# Patient Record
Sex: Male | Born: 1985 | Race: Asian | Hispanic: No | Marital: Married | Smoking: Never smoker
Health system: Southern US, Community
[De-identification: ages and names within clinical notes are randomized; demographics above are authoritative.]

## PROBLEM LIST (undated history)

## (undated) DIAGNOSIS — E079 Disorder of thyroid, unspecified: Secondary | ICD-10-CM

## (undated) HISTORY — DX: Disorder of thyroid, unspecified: E07.9

---

## 2011-08-14 ENCOUNTER — Encounter (HOSPITAL_COMMUNITY): Payer: Self-pay | Admitting: Emergency Medicine

## 2011-08-14 ENCOUNTER — Emergency Department (HOSPITAL_COMMUNITY): Payer: BC Managed Care – PPO

## 2011-08-14 ENCOUNTER — Emergency Department (HOSPITAL_COMMUNITY)
Admission: EM | Admit: 2011-08-14 | Discharge: 2011-08-14 | Disposition: A | Discharge status: Home / Self Care | Payer: BC Managed Care – PPO | Attending: Emergency Medicine | Admitting: Emergency Medicine

## 2011-08-14 DIAGNOSIS — R05 Cough: Secondary | ICD-10-CM | POA: Insufficient documentation

## 2011-08-14 DIAGNOSIS — R509 Fever, unspecified: Secondary | ICD-10-CM | POA: Insufficient documentation

## 2011-08-14 DIAGNOSIS — R000 Tachycardia, unspecified: Secondary | ICD-10-CM | POA: Insufficient documentation

## 2011-08-14 DIAGNOSIS — R059 Cough, unspecified: Secondary | ICD-10-CM | POA: Insufficient documentation

## 2011-08-14 DIAGNOSIS — R51 Headache: Secondary | ICD-10-CM | POA: Insufficient documentation

## 2011-08-14 DIAGNOSIS — J40 Bronchitis, not specified as acute or chronic: Secondary | ICD-10-CM

## 2011-08-14 MED ORDER — ALBUTEROL SULFATE HFA 108 (90 BASE) MCG/ACT IN AERS
1.0000 | INHALATION_SPRAY | RESPIRATORY_TRACT | Status: DC | PRN
  Filled 2011-08-14: qty 6.7

## 2011-08-14 MED ORDER — KETOROLAC TROMETHAMINE 30 MG/ML IJ SOLN
30.0000 mg | Freq: Once | INTRAMUSCULAR | Status: AC
  Administered 2011-08-14: 30 mg via INTRAVENOUS
  Filled 2011-08-14: qty 1

## 2011-08-14 NOTE — Discharge Instructions (Signed)
Bronchitis     Bronchitis is a problem of the air tubes leading to your lungs. This problem makes it hard for air to get in and out of the lungs. You may cough a lot because your air tubes are narrow. Going without care can cause lasting (chronic) bronchitis.  HOME CARE   · Drink enough fluids to keep your pee (urine) clear or pale yellow.   · Use a cool mist humidifier.   · Quit smoking if you smoke. If you keep smoking, the bronchitis might not get better.   · Only take medicine as told by your doctor.   GET HELP RIGHT AWAY IF:   · Coughing keeps you awake.   · You start to wheeze.   · You become more sick or weak.   · You have a hard time breathing or get short of breath.   · You cough up blood.   · Coughing lasts more than 2 weeks.   · You have a fever.   · Your baby is older than 3 months with a rectal temperature of 102° F (38.9° C) or higher.   · Your baby is 3 months old or younger with a rectal temperature of 100.4° F (38° C) or higher.   MAKE SURE YOU:  · Understand these instructions.   · Will watch your condition.   · Will get help right away if you are not doing well or get worse.   Document Released: 11/27/2007 Document Revised: 02/20/2011 Document Reviewed: 05/12/2009  [employer]® Patient Information ©2012 [employer].

## 2011-08-14 NOTE — ED Notes (Signed)
Pt alert, nad, c/o headache, fever, onset a few days ago, pt ambulates to triage, steady gait noted, speech clear, resp even unlabored, skin pwd

## 2011-08-14 NOTE — ED Provider Notes (Signed)
History     CSN: [id number]  Arrival date & time 08/14/11  4098   First MD Initiated Contact with Patient 08/14/11 2122      Chief Complaint  Patient presents with  . Headache    HPI This previously well male presents with headache, fever, cough.  He notes his symptoms began gradually 3 days ago.  Since onset symptoms have been constant.  He notes improvement with Tylenol or Advil.  He does not have subjective fever to to the absence of a thermometer.  He notes no disorientation, no confusion, mild lightheadedness.  He also notes mild extremity dysesthesia. The headache is diffuse, no visual complaints, no dysphasia. History reviewed. No pertinent past medical history.  History reviewed. No pertinent past surgical history.  No family history on file.  History  Substance Use Topics  . Smoking status: Never Smoker   . Smokeless tobacco: Not on file  . Alcohol Use: No      Review of Systems  Constitutional:       Per HPI, otherwise negative  HENT:       Per HPI, otherwise negative  Eyes: Negative.   Respiratory:       Per HPI, otherwise negative  Cardiovascular:       Per HPI, otherwise negative  Gastrointestinal: Negative for vomiting.  Genitourinary: Negative.   Musculoskeletal:       Per HPI, otherwise negative  Skin: Negative.   Neurological: Negative for syncope.    Allergies  Review of patient's allergies indicates no known allergies.  Home Medications   Current Outpatient Rx  Name Route Sig Dispense Refill  . ACETAMINOPHEN 500 MG PO TABS Oral Take 1,000 mg by mouth every 6 (six) hours as needed. pain    . IBUPROFEN 200 MG PO TABS Oral Take 400 mg by mouth every 6 (six) hours as needed. pain      BP 110/59  Pulse 105  Temp(Src) 99.6 F (37.6 C) (Oral)  Resp 16  Wt 155 lb (70.308 kg)  SpO2 99%  Physical Exam  Nursing note and vitals reviewed. Constitutional: He is oriented to person, place, and time. He appears well-developed. No distress.    HENT:  Head: Normocephalic and atraumatic.  Eyes: Conjunctivae and EOM are normal.  Cardiovascular: Normal rate and regular rhythm.   Pulmonary/Chest: Effort normal. No stridor. No respiratory distress.  Abdominal: He exhibits no distension.  Musculoskeletal: He exhibits no edema.  Neurological: He is alert and oriented to person, place, and time. No cranial nerve deficit. He exhibits normal muscle tone. Coordination normal.  Skin: Skin is warm and dry.  Psychiatric: He has a normal mood and affect.    ED Course  Procedures (including critical care time)  Labs Reviewed - No data to display No results found.   No diagnosis found.  Pulse oximetry 100% room-air normal Chest x-ray reviewed by me MDM  This generally well appearing young male p/w several days of cough, headache, fever.  On exam he is in no distress, is not hypoxic, is mildly tachycardic.  The patient's x-ray demonstrates changes consistent with bronchitis.  Patient stable for discharge with continued evaluation as needed with his primary care physician.  The patient was provided referral to our associated clinics.  He was discharged with an albuterol inhaler.       Gerhard Munch, MD 08/14/11 [phone]

## 2012-08-19 IMAGING — CR DG CHEST 2V
2 series · 2 of 2 positions shown · non-contrast
Comparison: None.

CLINICAL DATA: Cough and fever.

CHEST - 2 VIEW

[w chest pa]
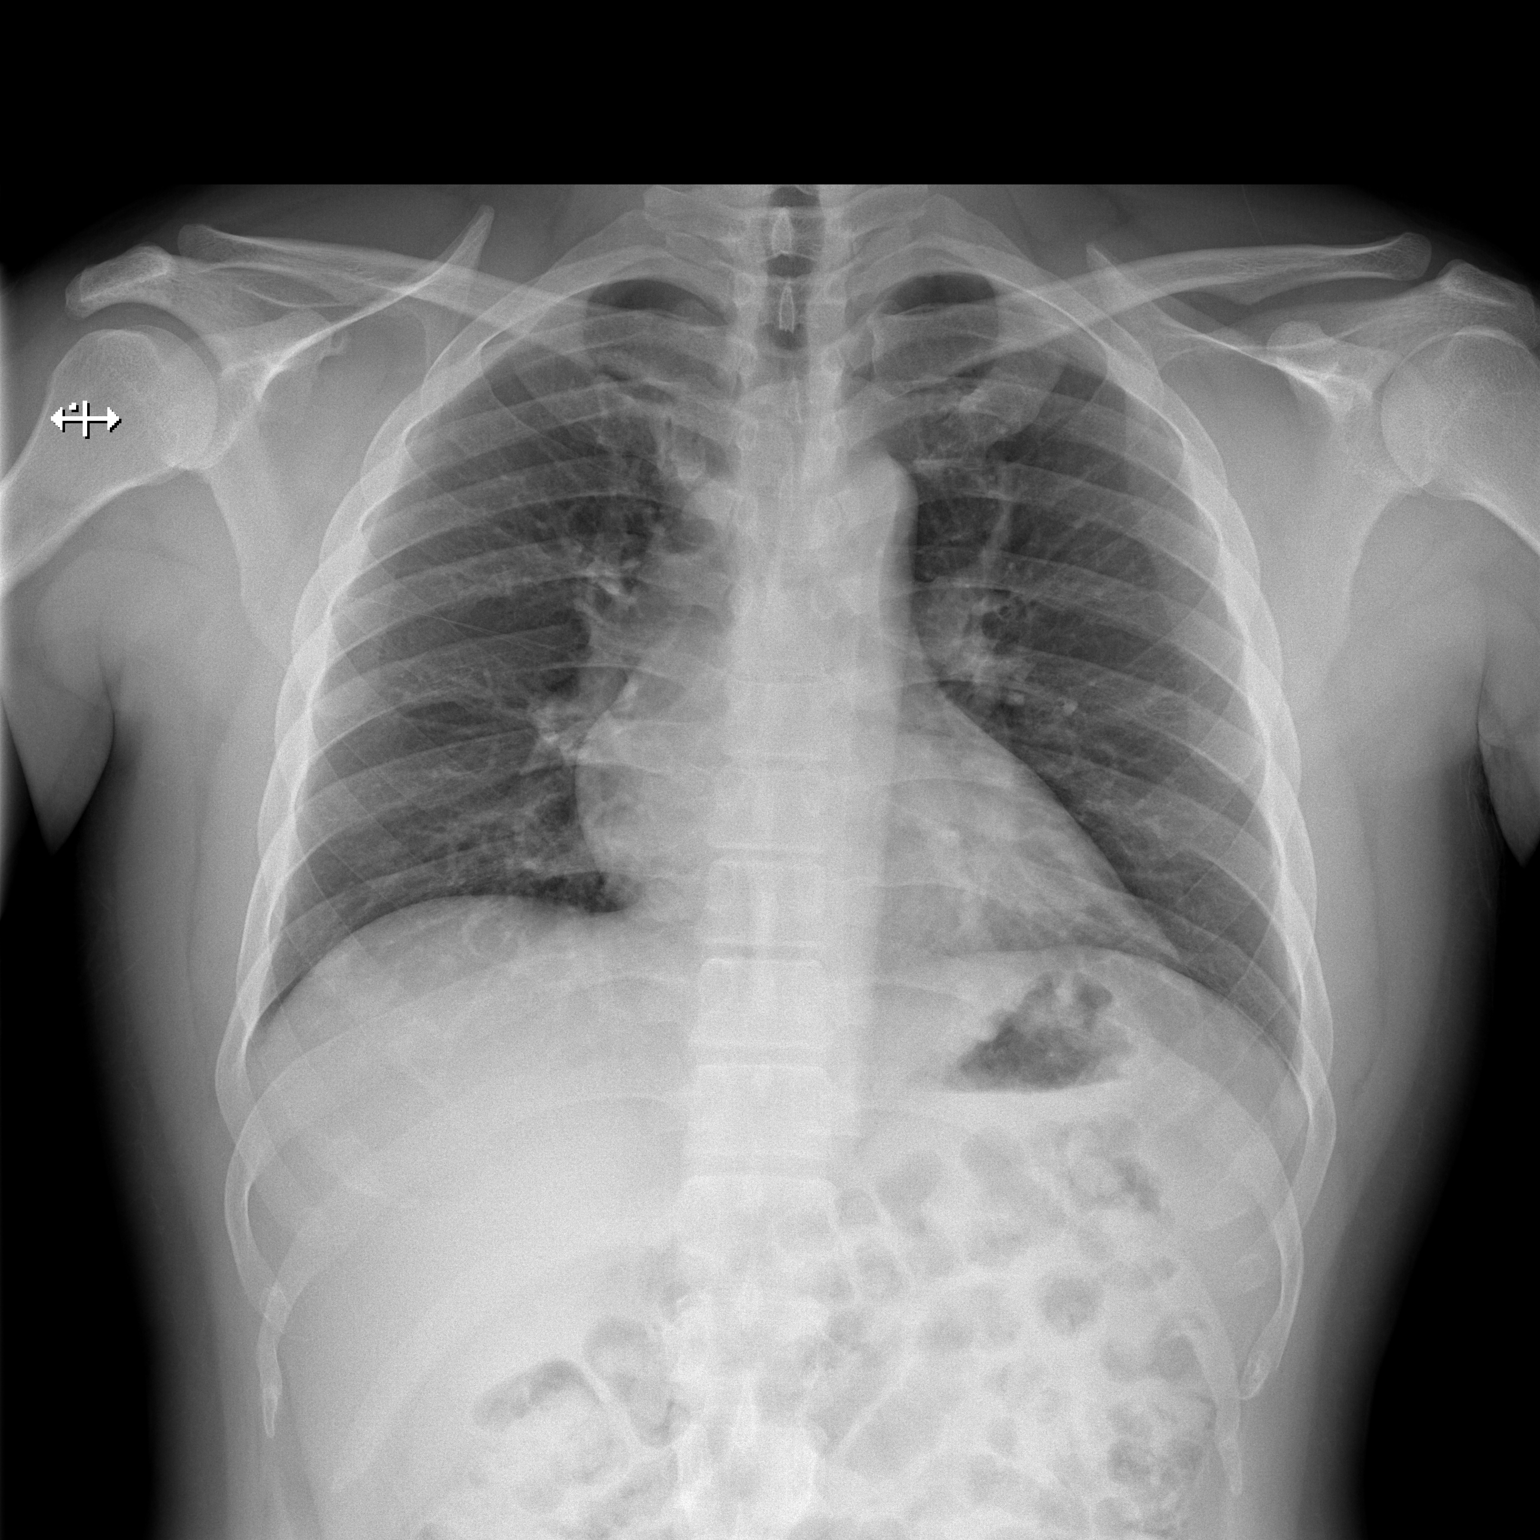

[w chest lat]
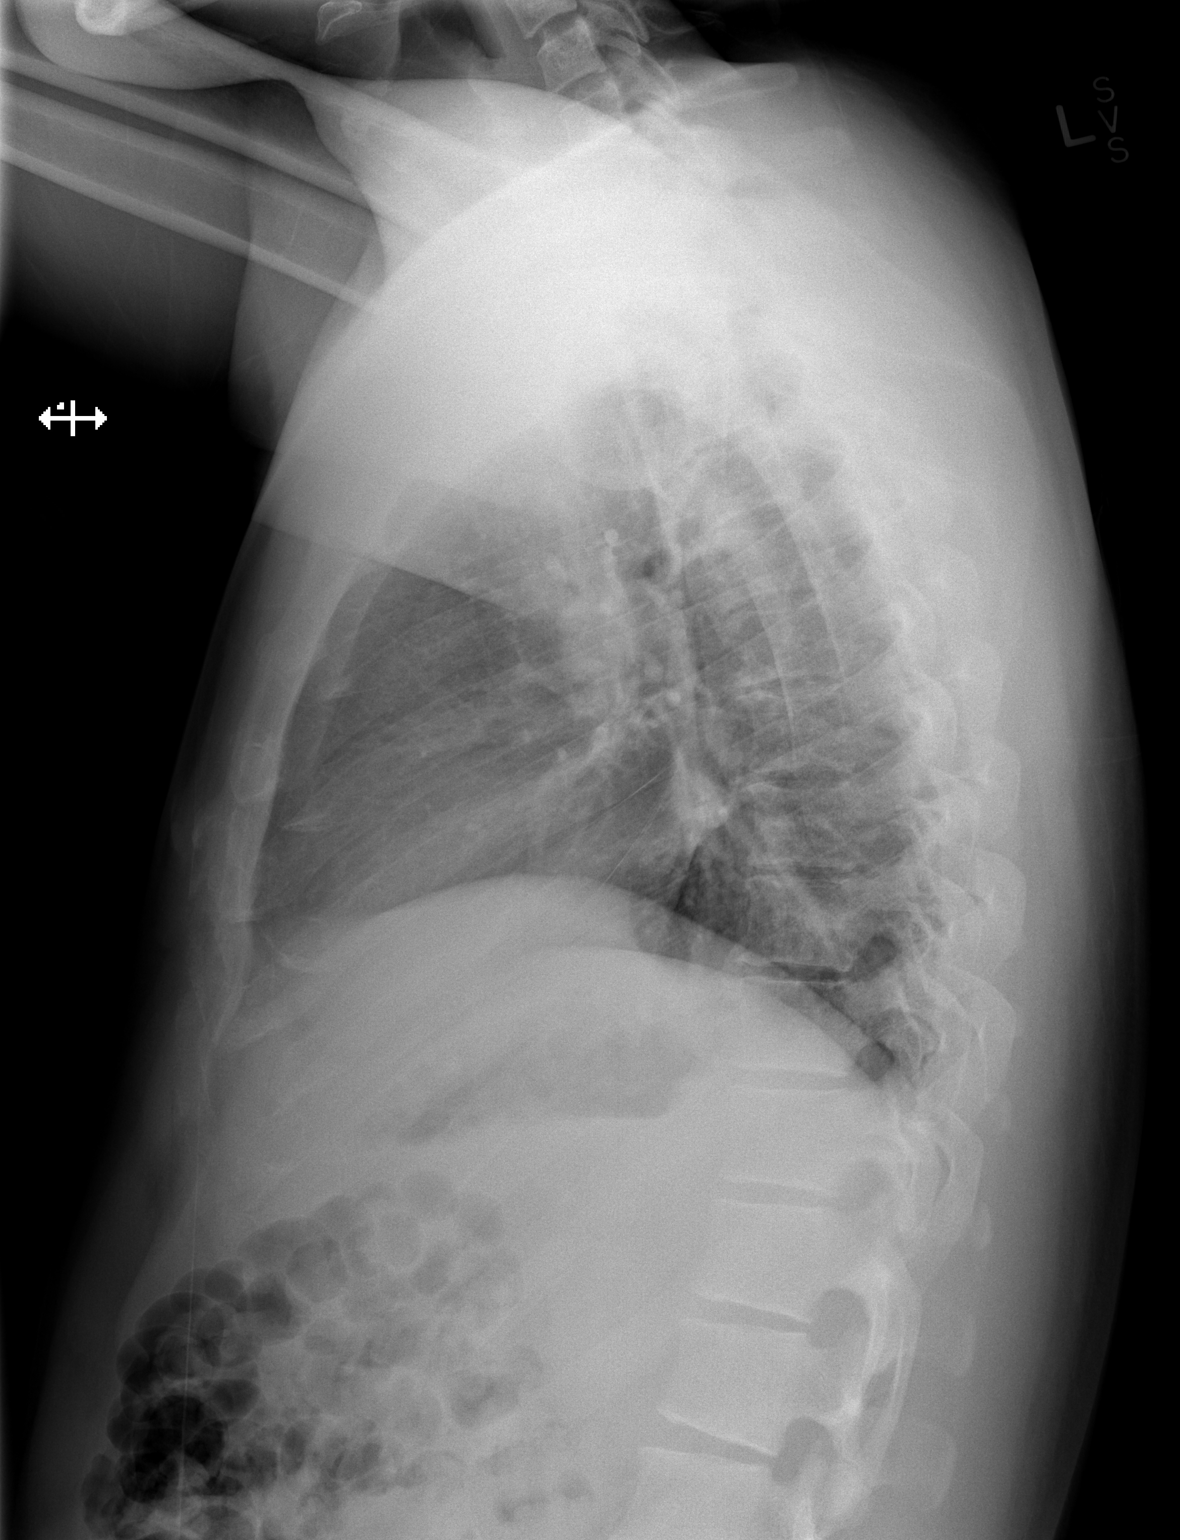

[2 of 2 positions shown; findings below may reference images not displayed]

FINDINGS: Normal sized heart.  Clear lungs.  Mild diffuse
peribronchial thickening.  Normal appearing bones.
IMPRESSION: Mild bronchitic changes.

## 2016-04-03 ENCOUNTER — Ambulatory Visit (INDEPENDENT_AMBULATORY_CARE_PROVIDER_SITE_OTHER): Payer: Managed Care, Other (non HMO) | Admitting: Urgent Care

## 2016-04-03 VITALS — BP 116/70 | HR 72 | Temp 98.0°F | Resp 16 | Ht 64.0 in | Wt 175.0 lb

## 2016-04-03 DIAGNOSIS — Z8249 Family history of ischemic heart disease and other diseases of the circulatory system: Secondary | ICD-10-CM

## 2016-04-03 DIAGNOSIS — Z0000 Encounter for general adult medical examination without abnormal findings: Secondary | ICD-10-CM | POA: Diagnosis not present

## 2016-04-03 NOTE — Patient Instructions (Addendum)

## 2016-04-03 NOTE — Progress Notes (Signed)
By signing my name below, I, Mesha Guinyard, attest that this documentation has been prepared under the direction and in the presence of Wallis Bamberg, [place].  Electronically Signed: Arvilla Market, Medical Scribe. 04/03/16. 1:55 PM.  MRN: [id number]  Subjective:   Mr. Jaquarious Grey is a 30 y.o. male presenting for annual physical exam.   Medical care team includes: PCP: No PCP Per Patient Vision: Pt wears contacts and goes to the optometrist every 6 months. Dental: Pt does not go to the dentist. Specialists: None.  Patient is married, has 2 children. 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 Has good relationships at home and work. Denies taking any chronic medications, or having any chronic symptoms. Denies smoking cigarettes or drinking alcohol.   Immunizations: Pt deferred the flu vaccine. Declines updating Tdap.   Health Maintenance: HIV Screening: Pt deferred HIV screening. Diet/Exercise: Pt exercises and occasionally eats healthy.  Trenell does not have any active problems on his problem list.   Daryn is not taking medications. He has No Known Allergies.  Hulet Denies past medical history. Denies past surgical history.   FHx: Dad has HTN.  Immunizations: Not up to date.  Review of Systems  Constitutional: Negative for chills, diaphoresis, fever, malaise/fatigue and weight loss.  HENT: Negative for congestion, ear discharge, ear pain, hearing loss, nosebleeds, sore throat and tinnitus.   Eyes: Negative for blurred vision, double vision, photophobia, pain, discharge and redness.  Respiratory: Negative for cough, shortness of breath and wheezing.   Cardiovascular: Negative for chest pain, palpitations and leg swelling.  Gastrointestinal: Negative for abdominal pain, blood in stool, constipation, diarrhea, nausea and vomiting.  Genitourinary: Negative for dysuria, flank pain, frequency, hematuria and urgency.  Musculoskeletal: Negative for back pain, joint pain and  myalgias.  Skin: Negative for itching and rash.  Neurological: Negative for dizziness, tingling, seizures, loss of consciousness, weakness and headaches.  Endo/Heme/Allergies: Negative for polydipsia.  Psychiatric/Behavioral: Negative for depression, hallucinations, memory loss, substance abuse and suicidal ideas. The patient is not nervous/anxious and does not have insomnia.    Objective:   Vitals: BP 116/70 (BP Location: Right Arm, Patient Position: Sitting, Cuff Size: Normal)    Pulse 72    Temp 98 F (36.7 C) (Oral)    Resp 16    Ht 5\' 4"  (1.626 m)    Wt 175 lb (79.4 kg)    SpO2 98%    BMI 30.04 kg/m   Physical Exam  Constitutional: He is oriented to person, place, and time. He appears well-developed and well-nourished.  HENT:  Head: Normocephalic and atraumatic.  Right Ear: Tympanic membrane, external ear and ear canal normal.  Left Ear: Tympanic membrane, external ear and ear canal normal.  Nose: Nose normal.  Mouth/Throat: Oropharynx is clear and moist.  TM's intact bilaterally, no effusions or erythema. Nasal turbinates pink and moist, nasal passages patent. No sinus tenderness. Oropharynx clear, mucous membranes moist, dentition in good repair.  Eyes: Conjunctivae and EOM are normal. Pupils are equal, round, and reactive to light. Right eye exhibits no discharge. Left eye exhibits no discharge. No scleral icterus.  Neck: Normal range of motion. Neck supple. No thyromegaly present.  Cardiovascular: Normal rate, regular rhythm and intact distal pulses.  Exam reveals no gallop and no friction rub.   No murmur heard. Pulmonary/Chest: Effort normal and breath sounds normal. No stridor. No respiratory distress. He has no wheezes. He has no rales.  Abdominal: Soft. Bowel sounds are normal. He exhibits no distension and no mass.  There is no tenderness. There is no CVA tenderness.  Musculoskeletal: Normal range of motion. He exhibits no edema or tenderness.  Lymphadenopathy:    He has  no cervical adenopathy.  Neurological: He is alert and oriented to person, place, and time. He has normal reflexes.  Reflex Scores:      Tricep reflexes are 2+ on the right side and 2+ on the left side.      Bicep reflexes are 2+ on the right side and 2+ on the left side.      Brachioradialis reflexes are 2+ on the right side and 2+ on the left side.      Patellar reflexes are 2+ on the right side and 2+ on the left side. Skin: Skin is warm and dry. No rash noted. No cyanosis or erythema. No pallor.  Psychiatric: He has a normal mood and affect.   Assessment and Plan :   1. Annual physical exam 2. Family history of hypertension - Patient is medically healthy. Discussed healthy lifestyle, diet, exercise, preventative care, vaccinations, and addressed patient's concerns.   Wallis BambergMario Mani, PA-C [facility] [phone] 04/03/2016  1:55 PM

## 2018-06-22 ENCOUNTER — Telehealth (HOSPITAL_COMMUNITY): Payer: Self-pay | Admitting: Emergency Medicine

## 2018-06-22 ENCOUNTER — Ambulatory Visit (HOSPITAL_COMMUNITY)
Admission: EM | Admit: 2018-06-22 | Discharge: 2018-06-22 | Disposition: A | Discharge status: Home / Self Care | Payer: Self-pay | Attending: Family Medicine | Admitting: Family Medicine

## 2018-06-22 ENCOUNTER — Encounter (HOSPITAL_COMMUNITY): Payer: Self-pay

## 2018-06-22 DIAGNOSIS — R634 Abnormal weight loss: Secondary | ICD-10-CM | POA: Insufficient documentation

## 2018-06-22 DIAGNOSIS — R000 Tachycardia, unspecified: Secondary | ICD-10-CM | POA: Insufficient documentation

## 2018-06-22 DIAGNOSIS — R197 Diarrhea, unspecified: Secondary | ICD-10-CM | POA: Insufficient documentation

## 2018-06-22 DIAGNOSIS — R251 Tremor, unspecified: Secondary | ICD-10-CM | POA: Insufficient documentation

## 2018-06-22 DIAGNOSIS — Z791 Long term (current) use of non-steroidal anti-inflammatories (NSAID): Secondary | ICD-10-CM | POA: Insufficient documentation

## 2018-06-22 LAB — TSH

## 2018-06-22 LAB — T4, FREE: Free T4: >5.5 ng/dL — ABNORMAL HIGH (ref 0.82–1.77)

## 2018-06-22 MED ORDER — PROPRANOLOL HCL 10 MG PO TABS: 10.0000 mg | ORAL_TABLET | Freq: Three times a day (TID) | ORAL | 0 refills | Status: DC

## 2018-06-22 NOTE — ED Provider Notes (Signed)
[facility]    CSN: [id number] Arrival date & time: 06/22/18  [id number]     History   Chief Complaint Chief Complaint  Patient presents with  . Shortness of Breath    HPI Aaron Grant is a 32 y.o. male.   HPI Patient states that he felt like he was overweight last January.  He was up to 180 pounds.  He started using a dietary supplement couple and losing weight.  He states that in the summer around August he quit using the dietary supplement because he had reached a good weight.  Ever since that time, soon after stopping the supplement, he noticed that he was feeling shaky and tremulous.  He has lost a little more weight.  His heart is beating fast.  When he exercises he feels like he is going to faint.  He has diarrhea.  He has not taken any more supplements.  Is not on any medication.  He is eating a regular diet.  He is been unable to work since September. History reviewed. No pertinent past medical history.  There are no active problems to display for this patient.   History reviewed. No pertinent surgical history.     Home Medications    Prior to Admission medications   Medication Sig Start Date End Date Taking? Authorizing Provider  ibuprofen (ADVIL,MOTRIN) 200 MG tablet Take 400 mg by mouth every 6 (six) hours as needed. pain    [provider]  propranolol (INDERAL) 10 MG tablet Take 1 tablet (10 mg total) by mouth 3 (three) times daily. 06/22/18   Eustace MooreNelson, Emory Leaver Sue, MD    Family History History reviewed. No pertinent family history.  Father has psoriasis.  No family history of diabetes, hormonal disorders, thyroid Social History Social History   Tobacco Use  . Smoking status: Never Smoker  . Smokeless tobacco: Never Used  Substance Use Topics  . Alcohol use: No  . Drug use: Not on file     Allergies   Patient has no known allergies.   Review of Systems Review of Systems   Physical Exam Triage Vital Signs ED Triage Vitals  [06/22/18 1112]  Enc Vitals Group     BP 124/74     Pulse Rate (!) 103     Resp 18     Temp 97.9 F (36.6 C)     Temp Source Oral     SpO2 100 %     Weight      Height      Head Circumference      Peak Flow      Pain Score 0     Pain Loc      Pain Edu?      Excl. in GC?    No data found.  Updated Vital Signs BP 124/74 (BP Location: Right Arm)   Pulse (!) 103   Temp 97.9 F (36.6 C) (Oral)   Resp 18   SpO2 100%       Physical Exam   UC Treatments / Results  Labs (all labs ordered are listed, but only abnormal results are displayed) Labs Reviewed  TSH - Abnormal; Notable for the following components:      Result Value   TSH <0.010 (*)    All other components within normal limits  T4, FREE - Abnormal; Notable for the following components:   Free T4 >5.50 (*)    All other components within normal limits  T3, FREE  tests were reported  after patient had been discharged home.  He was called at home with test results  EKG None  Radiology No results found.  Procedures Procedures (including critical care time)  Medications Ordered in UC Medications - No data to display  Initial Impression / Assessment and Plan / UC Course  I have reviewed the triage vital signs and the nursing notes.  Pertinent labs & imaging results that were available during my care of the patient were reviewed by me and considered in my medical decision making (see chart for details).     Patient was discharged with symptoms of tachycardia tremulousness and weight loss, laboratory work subsequently came back confirming my suspicion he had hypothyroidism.  He will be referred as soon as we can get an appointment with endocrinology. Final Clinical Impressions(s) / UC Diagnoses   Final diagnoses:  Tremulousness  Tachycardia  Loss of weight  Diarrhea, unspecified type     Discharge Instructions     I believe that you have an overactive thyroid Do not drink caffeine I have done  blood work, will notify you of the chest results Take propanolol 3 times a day for the shakiness and rapid heart rate We will need to arrange follow-up once the blood work is back.  You may need to see an endocrinologist (hormone specialist)    ED Prescriptions    Medication Sig Dispense Auth. Provider   propranolol (INDERAL) 10 MG tablet Take 1 tablet (10 mg total) by mouth 3 (three) times daily. 90 tablet Eustace MooreNelson, Akaiya Touchette Sue, MD     Controlled Substance Prescriptions [place] Controlled Substance Registry consulted?    Eustace MooreNelson, Alanea Woolridge Sue, MD 06/22/18 2123

## 2018-06-22 NOTE — ED Triage Notes (Signed)
Pt cc of shortness of breath, pt states when he is working outside he has a hard time catching his breath.  Pt denies chest pains,coughing.

## 2018-06-22 NOTE — Discharge Instructions (Signed)
I believe that you have an overactive thyroid Do not drink caffeine I have done blood work, will notify you of the chest results Take propanolol 3 times a day for the shakiness and rapid heart rate We will need to arrange follow-up once the blood work is back.  You may need to see an endocrinologist (hormone specialist)

## 2018-06-22 NOTE — Telephone Encounter (Signed)
Attempted to call patient. Number not in service. Letter sent.

## 2018-06-22 NOTE — Telephone Encounter (Signed)
Phone number was incorrect. Correct number is [phone]. Attempted to contact patient. No answer. Voicemail left.

## 2018-06-23 LAB — T3, FREE: T3, Free: >32.6 pg/mL — ABNORMAL HIGH (ref 2.0–4.4)

## 2018-06-26 ENCOUNTER — Telehealth (HOSPITAL_COMMUNITY): Payer: Self-pay | Admitting: Emergency Medicine

## 2018-06-30 ENCOUNTER — Telehealth (HOSPITAL_COMMUNITY): Payer: Self-pay | Admitting: Emergency Medicine

## 2018-06-30 NOTE — Telephone Encounter (Signed)
Called patient and spoke with him on the phone. All questions answered. [facility] no [facility]ts available until end of feb. Called  [facility], appointment will be available sometime next week. Gave information to the patient, told patient to expect a call in the next few days for appointment, and if he doesn't hear from them to call them back. Pt agreeable to plan, will follow up with endo.

## 2018-07-03 ENCOUNTER — Encounter: Payer: Self-pay | Admitting: Endocrinology

## 2018-07-03 ENCOUNTER — Ambulatory Visit: Payer: Self-pay | Admitting: Endocrinology

## 2018-07-03 VITALS — BP 122/74 | HR 111 | Ht 64.0 in | Wt 137.8 lb

## 2018-07-03 DIAGNOSIS — E0590 Thyrotoxicosis, unspecified without thyrotoxic crisis or storm: Secondary | ICD-10-CM

## 2018-07-03 MED ORDER — METHIMAZOLE 10 MG PO TABS: 20.0000 mg | ORAL_TABLET | Freq: Two times a day (BID) | ORAL | 1 refills | Status: DC

## 2018-07-03 NOTE — Patient Instructions (Addendum)
Propranolol 2 in am and 2 in pm  What is hyperthyroidism?  Hyperthyroidism develops when the body is exposed to excessive amounts of thyroid hormone. This disorder occurs in almost one percent of all Americans and affects women five to 10 times more often than men. In its mildest form, hyperthyroidism may not cause recognizable symptoms. More often, however, the symptoms are discomforting, disabling or even life-threatening.  What are the causes of hyperthyroidism?  Luiz Blare' disease: Graves' disease (named after [place] physician Sloan Leiter) is an autoimmune disorder that frequently results in thyroid enlargement and hyperthyroidism. In some patients, swelling of the muscles and other tissues around the eyes may develop, causing eye prominence, discomfort or double vision. Like other autoimmune diseases, this condition tends to affect multiple family members. It is much more common in women than in men and tends to occur in younger patients. . Postpartum thyroiditis: Five to 10 percent of women develop mild to moderate hyperthyroidism within several months of giving birth. Hyperthyroidism in this condition usually lasts for approximately one to two months. It is often followed by several months of hypothyroidism, but most women will eventually recover normal thyroid function. In some cases, however, the thyroid gland does not heal, so the hypothyroidism becomes permanent and requires lifelong thyroid hormone replacement. This condition may occur again with subsequent pregnancies. . Silent thyroiditis: Transient (temporary) hyperthyroidism can be caused by silent thyroiditis, a condition which appears to be the same as postpartum thyroiditis, but is not related to pregnancy. It is not accompanied by a painful thyroid gland. . Subacute thyroiditis: This condition may follow a viral infection and is characterized by painful thyroid gland enlargement and inflammation, which results in the release of large  amounts of thyroid hormones into the blood. Fortunately, this condition usually resolves spontaneously. The thyroid usually heals itself over several months, but often not before a temporary period of low thyroid hormone production (hypothyroidism) occurs. . Toxic multinodular goiter: Multiple nodules in the thyroid can produce excessive thyroid hormone, causing hyperthyroidism. Typically diagnosed in patients over the age of 62, this disorder is more likely to affect heart rhythm. In many cases, the person has had the goiter for many years before it becomes overactive. . Toxic nodule: A single nodule or lump in the thyroid can also produce more thyroid hormone than the body requires and lead to hyperthyroidism. This disorder is not familial. . Excessive iodine ingestion: Various sources of high iodine concentrations, such as kelp tablets, some expectorants, amiodarone (Cordarone, Pacerone - medications used to treat certain problems with heart rhythms) and x-ray dyes may occasionally cause hyperthyroidism in patients who are prone to it. . Overmedication with thyroid hormone: Patients who receive excessive thyroxine replacement treatment can develop hyperthyroidism. They should have their thyroid hormone dosage evaluated by a physician at least once each year and should NEVER give themselves "extra" doses.   What are the signs and symptoms of hyperthyroidism? When hyperthyroidism develops, a goiter (enlargement of the thyroid) is usually present and may be associated with some or many of the following features: . Fast heart rate, often more than 100 beats per minute . Becoming anxious, irritable, argumentative . Trembling hands . Weight loss, despite eating the same amount or even more than usual . Intolerance of warm temperatures and increased likelihood to perspire . Loss of scalp hair . Tendency of fingernails to separate from the nail bed . Muscle weakness, especially of the upper arms and  thighs . Loose and frequent bowel movements .  Smooth skin . Change in menstrual pattern . Increased likelihood for miscarriage . Prominent "stare" of the eyes . Protrusion of the eyes, with or without double vision (in patients with Graves' disease) . Irregular heart rhythm, especially in patients older than 35 years of age . Accelerated loss of calcium from bones, which increases the risk of osteoporosis and fractures   How is hyperthyroidism diagnosed? Sometimes a general physician can diagnose and treat the cause of hyperthyroidism, but assistance is often needed from an endocrinologist, a physician who specializes in managing thyroid disease. Characteristic symptoms and physical signs of the disease can be detected by a trained physician. In addition, tests can be used to confirm the diagnosis and to determine the cause.  Tests TSH (THYROID-STIMULATING HORMONE OR THYROTROPIN): A low TSH level in the blood is the most accurate indicator of hyperthyroidism. The body shuts off production of this pituitary hormone when the thyroid gland even slightly overproduces thyroid hormone. If the TSH level is low, it is very important to also check thyroid hormone levels to confirm the diagnosis of hyperthyroidism.  ESTIMATES OF FREE THYROXINE AND FREE TRIIODOTHYRONINE: When hyperthyroidism develops, free thyroxine and free triiodothyronine levels rise above previous values in that specific patient (although they may still fall within the normal range for the general population) and are often considerably elevated. TSI (THYROID-STIMULATING IMMUNOGLOBULIN): A substance often found in the blood when Graves' disease is the cause of hyperthyroidism. RADIOACTIVE IODINE UPTAKE (RAIU): The amount of iodine the thyroid gland can collect, and a thyroid scan, which shows how the iodine is distributed throughout the thyroid gland.  THYROID SCAN: This information can be useful in determining the cause of  hyperthyroidism and, ultimately, its treatment.   How is hyperthyroidism treated? Appropriate management of hyperthyroidism requires careful evaluation and ongoing care by a physician experienced in the treatment of this complex condition. Before the development of current treatment options, the death rate from severe hyperthyroidism was as high as 50 percent. Now several effective treatments are available and, with proper management, death from hyperthyroidism is rare. Deciding which treatment is best depends on what caused the hyperthyroidism, its severity and other conditions present.   . Antithyroid Drugs In the [place], two drugs are available for treating hyperthyroidism: propylthiouracil (PTU) and methimazole (Tapazole). Except for early pregnancy, methimazole is preferred because PTU can cause fatal liver damage, although rarely. These medications control hyperthyroidism by slowing thyroid hormone production. They may take several months to normalize thyroid hormone levels. Some patients with hyperthyroidism caused by Graves' disease experience a spontaneous or natural remission of hyperthyroidism after a 12- to 56-month course of treatment with these drugs and may sometimes avoid permanent underactivity of the thyroid (hypothyroidism), which often occurs as a result of using the other methods of treating hyperthyroidism. Unfortunately, the remission is frequently only temporary, with the hyperthyroidism recurring after several months or years off medication and requiring additional treatment, so relatively few patients are treated solely with antithyroid medication in the [place]. Antithyroid drugs may cause an allergic reaction in about five percent of patients who use them. This usually occurs during the first six weeks of drug treatment. Such a reaction may include rash or hives; but after discontinuing use of the drug, the symptoms resolve within one to two weeks and there is no  permanent damage. A more serious side effect, but occurring in only about one in 250-500 patients during the first four to eight weeks of treatment, is a rapid decrease of  white blood cells in the bloodstream. This could increase susceptibility to serious infection. Symptoms such as a sore throat, infection or fever should be reported promptly to your physician, and a white blood cell count should be done immediately. In nearly every case, when a person stops using the medication, the white blood cell count returns to normal. Very rarely, antithyroid drugs may cause severe liver problems, which can be detected by monitoring blood tests or joint problems characterized by joint pain and/or swelling. Your physician should be contacted if there is yellowing of the skin (jaundice), fever, loss of appetite or abdominal pain.  . Radioactive Iodine Treatment Iodine is an essential ingredient in the production of thyroid hormone. Each molecule of thyroid hormone contains either four (T4) or three (T3) molecules of iodine. Since most overactive thyroid glands are quite hungry for iodine, it was discovered in the 1940s that the thyroid could be "tricked" into destroying itself by simply feeding it radioactive iodine. The radioactive iodine is given by mouth, usually in capsule form, and is quickly absorbed from the bowel. It then enters the thyroid cells from the bloodstream and gradually destroys them. Maximal benefit is usually noted within three to six months. It is not possible to eliminate "just the right amount" of the diseased thyroid gland, since radioiodine eventually damages all thyroid cells. Therefore, most endocrinologists strive to completely destroy the diseased thyroid gland with a single dose of radioiodine. This results in the intentional development of an underactive thyroid state (hypothyroidism), which is easily, predictably and inexpensively corrected by lifelong daily use of oral thyroid hormone  replacement therapy. Although every effort is made to calculate the correct dose of radioiodine for each patient, not every treatment will successfully correct the hyperthyroidism, particularly if the goiter is quite large and a second dose of radioactive iodine is occasionally needed.   . Other Treatments A drug from the class of beta-adrenergic blocking agents (which decrease the effects of excess thyroid hormone) may be used temporarily to control hyperthyroid symptoms until other therapies take effect. In cases where hyperthyroidism is caused by thyroiditis or excessive ingestion of either iodine or thyroid hormone, this may be the only type of treatment required. Iodine drops are prescribed when hyperthyroidism is severe or prior to undergoing surgery for Graves' disease.

## 2018-07-03 NOTE — Progress Notes (Addendum)
Patient ID: Aaron Grant, male   DOB: 01/24/1986, 33 y.o.   MRN: [id number]                                                                                                              Reason for Appointment:  Hyperthyroidism, new consultation  Referring healthcare provider: ER physician   Chief complaint: Shortness of breath   History of Present Illness:   For the last 4 to 5 months patient has had symptoms of shortness of breath on exertion, some palpitations, shakiness, feeling excessively warm and sweaty, weakness, and fatigue. 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 Also when he is trying to squat and get up he feels weak He tends to have loose stools after every meal No change in appetite  The patient has lost about 10-20 Lbs since these symptoms started  Wt Readings from Last 3 Encounters:  07/03/18 137 lb 12.8 oz (62.5 kg)  04/03/16 175 lb (79.4 kg)  08/14/11 155 lb (70.3 kg)     Treatments so far: Inderal 10 mg 3 times daily  Thyroid function tests as follows:     Lab Results  Component Value Date   FREET4 >5.50 (H) 06/22/2018   T3FREE >32.6 (H) 06/22/2018   TSH <0.010 (L) 06/22/2018    No results found for: THYROTRECAB   Allergies as of 07/03/2018   No Known Allergies     Medication List       Accurate as of July 03, 2018 11:29 AM. Always use your most recent med list.        methimazole 10 MG tablet Commonly known as:  TAPAZOLE Take 2 tablets (20 mg total) by mouth 2 (two) times daily.   propranolol 10 MG tablet Commonly known as:  INDERAL Take 1 tablet (10 mg total) by mouth 3 (three) times daily.           Past Medical History:  Diagnosis Date  . Thyroid disease     History reviewed. No pertinent surgical history.  Family History  Problem Relation Age of Onset  . Thyroid cancer Sister     Social History:  reports that he has never smoked. He has never used  smokeless tobacco. He reports that he does not drink alcohol. No history on file for drug.  Allergies: No Known Allergies   Review of Systems  Constitutional: Positive for weight loss.  Eyes: Negative for blurred vision.       Sometimes eyes will water  Respiratory: Positive for shortness of breath.   Gastrointestinal: Positive for diarrhea.  Endocrine: Positive for heat intolerance.  Skin: Negative for itching.  Neurological: Positive for tremors.       Examination:   BP 122/74 (BP Location: Left Arm, Patient Position: Sitting, Cuff Size: Normal)   Pulse (!) 111   Ht 5\' 4"  (1.626 m)   Wt 137 lb 12.8 oz (62.5 kg)   SpO2 96%   BMI 23.65  kg/m    General Appearance:  Averagely and nourished, pleasant, not anxious or hyperkinetic.         Eyes:  Appear prominent with mild proptosis, has mild lid lag and stare present.  Has mild swelling of the eyelids   Neck: The thyroid is enlarged about 2 times normal, smooth, relatively soft non-tender and diffuse.  No bruit heard  There is no lymphadenopathy in the neck .           Heart: normal S1 and S2, no murmurs .          Lungs: breath sounds are normal bilaterally without added sounds  Abdomen: no hepatosplenomegaly or other palpable abnormality  Extremities: hands are warm. No ankle edema.  Neurological:  Bilateral fine tremors are present. Deep tendon reflexes at biceps are brisk.  Skin: No rash, abnormal thickening of the skin on the lower legs seen     Assessment/Plan:   Hyperthyroidism, Likely to be from Graves' disease   He has very significant symptoms and weight loss starting in August 2019 Has been newly diagnosed and is only taking small doses of propranolol from his ER physician   Discussed with the patient the hyperthyroidism is a result of an autoimmune condition involving the thyroid.  Explained that the options for treatment are basically antithyroid drugs and radioactive iodine.  Discussed the pros and  cons for each treatment: Antithyroid drugs would be reasonable for mild disease but would need frequent followup with lab monitoring as well as potential for side effects from the medications and uncertainty about long-term cure of the problem Discussed that I-131 treatment is safe and simple to do but will result in long-term hypothyroidism that will result from ablation of the thyroid .  Most likely patient will need I-131 treatment However since he has marked increase in thyroid levels and symptoms he will be treated with methimazole 20 mg twice daily initially  Patient handout on hyperthyroidism and radioactive iodine treatment given Patient understands the above discussion and treatment options. All questions were answered satisfactorily  Follow-up in 1 month  Reather Littler 07/03/2018, 11:29 AM    Note: This office note was prepared with Dragon voice recognition system technology. Any transcriptional errors that result from this process are unintentional.

## 2018-07-31 ENCOUNTER — Other Ambulatory Visit (INDEPENDENT_AMBULATORY_CARE_PROVIDER_SITE_OTHER): Payer: Self-pay

## 2018-07-31 DIAGNOSIS — E0590 Thyrotoxicosis, unspecified without thyrotoxic crisis or storm: Secondary | ICD-10-CM

## 2018-07-31 LAB — CBC
HCT: 43.4 % (ref 39.0–52.0)
Hemoglobin: 14.6 g/dL (ref 13.0–17.0)
MCHC: 33.7 g/dL (ref 30.0–36.0)
MCV: 80.2 fl (ref 78.0–100.0)
Platelets: 256 10*3/uL (ref 150.0–400.0)
RBC: 5.41 Mil/uL (ref 4.22–5.81)
RDW: 14.2 % (ref 11.5–15.5)
WBC: 7.8 10*3/uL (ref 4.0–10.5)

## 2018-07-31 LAB — T4, FREE: FREE T4: 2.9 ng/dL — AB (ref 0.60–1.60)

## 2018-07-31 LAB — ALT: ALT: 88 U/L — ABNORMAL HIGH (ref 0–53)

## 2018-08-05 ENCOUNTER — Ambulatory Visit: Payer: Self-pay | Admitting: Endocrinology

## 2018-08-05 DIAGNOSIS — Z0289 Encounter for other administrative examinations: Secondary | ICD-10-CM

## 2018-08-06 ENCOUNTER — Telehealth: Payer: Self-pay | Admitting: Endocrinology

## 2018-08-06 NOTE — Telephone Encounter (Signed)
Patient no showed today's appt. Please advise on how to follow up. °A. No follow up necessary. °B. Follow up urgent. Contact patient immediately. °C. Follow up necessary. Contact patient and schedule visit in ___ days. °D. Follow up advised. Contact patient and schedule visit in ____weeks. ° °Would you like the NS fee to be applied to this visit? ° °

## 2018-08-06 NOTE — Telephone Encounter (Signed)
He needs to be scheduled for follow-up but needs labs before visit.  No-show fee will apply unless he has a valid reason for not coming

## 2018-08-07 ENCOUNTER — Ambulatory Visit (INDEPENDENT_AMBULATORY_CARE_PROVIDER_SITE_OTHER): Payer: Self-pay | Admitting: Endocrinology

## 2018-08-07 ENCOUNTER — Encounter: Payer: Self-pay | Admitting: Endocrinology

## 2018-08-07 VITALS — BP 116/60 | HR 113 | Ht 64.0 in | Wt 148.4 lb

## 2018-08-07 DIAGNOSIS — E0590 Thyrotoxicosis, unspecified without thyrotoxic crisis or storm: Secondary | ICD-10-CM | POA: Insufficient documentation

## 2018-08-07 MED ORDER — PROPRANOLOL HCL ER 80 MG PO CP24: 80.0000 mg | ORAL_CAPSULE | Freq: Every day | ORAL | 1 refills | Status: AC

## 2018-08-07 NOTE — Progress Notes (Signed)
Patient ID: Aaron Grant, male   DOB: 1986-04-04, 33 y.o.   MRN: [id number]                                                                                                              Reason for Appointment:  Hyperthyroidism, follow-up visit  Referring healthcare provider: ER physician   Chief complaint: Follow-up   History of Present Illness:   Baseline history: For the last 4 to 5 months patient has had symptoms of shortness of breath on exertion, some palpitations, shakiness, feeling excessively warm and sweaty, weakness, and fatigue. 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 Also when he is trying to squat and get up he feels weak He tends to have loose stools after every meal   The patient had lost about 10-20 Lbs since these symptoms started  RECENT history:  He is taking methimazole 20 mg twice daily as prescribed He feels better with less shortness of breath and shakiness Also not as weak His weight is improving  Wt Readings from Last 3 Encounters:  08/07/18 148 lb 6.4 oz (67.3 kg)  07/03/18 137 lb 12.8 oz (62.5 kg)  04/03/16 175 lb (79.4 kg)     Treatments so far: Inderal 10 mg 3 times daily  Thyroid function tests as follows:     Lab Results  Component Value Date   FREET4 2.90 (H) 07/31/2018   FREET4 >5.50 (H) 06/22/2018   T3FREE >32.6 (H) 06/22/2018   TSH <0.010 (L) 06/22/2018    No results found for: THYROTRECAB   Allergies as of 08/07/2018   No Known Allergies     Medication List       Accurate as of August 07, 2018 11:08 AM. Always use your most recent med list.        methimazole 10 MG tablet Commonly known as:  TAPAZOLE Take 2 tablets (20 mg total) by mouth 2 (two) times daily.   propranolol 10 MG tablet Commonly known as:  INDERAL Take 1 tablet (10 mg total) by mouth 3 (three) times daily.           Past Medical History:  Diagnosis Date  . Thyroid disease      History reviewed. No pertinent surgical history.  Family History  Problem Relation Age of Onset  . Thyroid cancer Sister     Social History:  reports that he has never smoked. He has never used smokeless tobacco. He reports that he does not drink alcohol. No history on file for drug.  Allergies: No Known Allergies   Review of Systems  Thyroid is enlarged about twice normal bilaterally, slightly firm Mild tremor present. Heart rate 100 regular Biceps reflexes are brisk   Examination:   BP 116/60 (BP Location: Left Arm, Patient Position: Sitting, Cuff Size: Normal)   Pulse (!) 113   Ht 5\' 4"  (1.626 m)   Wt 148 lb 6.4 oz (67.3 kg)   SpO2 98%  BMI 25.47 kg/m         Assessment/Plan:   Hyperthyroidism, Likely to be from Graves' disease   He has significant baseline symptoms  Even with 40 mg of methimazole his thyroid levels are not quite normal although he appears to be symptomatically better  Still has significant thyroid enlargement  Again discussed that because of his severe hyperthyroidism he should be treated with I-131  Since his levels are improved and he is subjectively doing better with plan on doing I-131 treatment Again discussed how this will be done I-131 uptake has been ordered  In the meantime he will increase his methimazole to 5 tablets daily and his propranolol will be changed to 80 mg once a day for convenience  Increased ALT: Likely to be from hyperthyroidism   Reather Littler 08/07/2018, 11:08 AM    Note: This office note was prepared with Dragon voice recognition system technology. Any transcriptional errors that result from this process are unintentional.

## 2018-08-07 NOTE — Patient Instructions (Signed)
Increase methimazole to 3 tablets in the morning and 2 in the evening When you are scheduled for the testing for the radioactive iodine treatment please note the following instructions:  7 days before the scheduled test dose stop the methimazole medication The test dose will take 2 days and the report will be then used to calculate the treatment dose  Restart the methimazole 3 days after the actual radioactive iodine treatment  COMMON QUESTIONS ABOUT RADIOACTIVE IODINE TREATMENT   Why is radioactive iodine used?  Radioactive iodine is a very common option for treating an overactive thyroid. Normally the thyroid gland uses iodine to make thyroid hormone. An overactive thyroid gland extracts the iodine more completely from the bloodstream. When radioactive iodine is given orally most of it is retained within the overactive thyroid.  The concentrated radioactivity slowly destroys the thyroid cells and controls the over activity.  Because the radioactive rays travel only a very short distance other organs are not affected.  Thus the radioactive iodine works in a targeted manner effectively and safely.   How is radioactive iodine given?  It is usually given as a capsule in a single dose.  First, a very small test dose of radioactive iodine is given and the amount retained in the thyroid is measured the next day with a special counter.  This helps [place] calculate the dose of radioactive iodine to be given for treatment.  The radioactive iodine is given under the supervision of a Radiologist with the proper precautions.  Since the effective dose of the radioactive iodine is approximate, rarely one may need a second dose to control the over activity.Thus the treatment is extremely simple to take.  What happens after the radioactive iodine is given?  There is a gradual reduction in the over activity of the thyroid.  Initially it takes time to get rid of the excess thyroid hormone already present in the  body.  With the slow destruction of the thyroid cells the high levels start coming down after 2 to 3 weeks.  It may take up to 8 weeks to completely control the thyroid.  Usually most of the thyroid cells are destroyed and thyroid levels start getting low by two months.  However this will vary from patient to patient and the thyroid levels may remain normal for some time.  It is very important to have regular follow-up after the treatment.  Once the thyroid levels get low you will be started on a thyroid supplement, taken once daily for life.    Are there any side effects of radioactive iodine?  Generally no side effects are encountered.  Rarely one may have discomfort and swelling in the thyroid gland for a few days.  This should be reported if there is significant pain.  The radiation exposure to internal organs from radioactive iodine is no more than in a kidney x-ray or barium studies.  There are no effects on reproductive organs but women who are pregnant or nursing should not take radioactive iodine.  No long-term effects including cancer have been seen.  Women can have children after treatment with radioactive iodine although it is recommended to avoid pregnancy for about six months.

## 2018-08-08 MED ORDER — METHIMAZOLE 10 MG PO TABS: ORAL_TABLET | ORAL | 1 refills | Status: DC

## 2018-09-21 ENCOUNTER — Other Ambulatory Visit: Payer: Self-pay

## 2018-09-21 ENCOUNTER — Other Ambulatory Visit (INDEPENDENT_AMBULATORY_CARE_PROVIDER_SITE_OTHER): Payer: Self-pay

## 2018-09-21 DIAGNOSIS — E0590 Thyrotoxicosis, unspecified without thyrotoxic crisis or storm: Secondary | ICD-10-CM

## 2018-09-21 LAB — T4, FREE: FREE T4: 4.95 ng/dL — AB (ref 0.60–1.60)

## 2018-09-25 ENCOUNTER — Encounter: Payer: Self-pay | Admitting: Endocrinology

## 2018-09-25 ENCOUNTER — Ambulatory Visit (INDEPENDENT_AMBULATORY_CARE_PROVIDER_SITE_OTHER): Payer: Self-pay | Admitting: Endocrinology

## 2018-09-25 ENCOUNTER — Other Ambulatory Visit: Payer: Self-pay

## 2018-09-25 VITALS — BP 110/64 | HR 91 | Ht 64.0 in | Wt 150.4 lb

## 2018-09-25 DIAGNOSIS — E0590 Thyrotoxicosis, unspecified without thyrotoxic crisis or storm: Secondary | ICD-10-CM

## 2018-09-25 MED ORDER — METHIMAZOLE 10 MG PO TABS: ORAL_TABLET | ORAL | 1 refills | Status: DC

## 2018-09-25 NOTE — Progress Notes (Signed)
Patient ID: Aaron Grant, male   DOB: 04-26-1986, 33 y.o.   MRN: [id number]                                                                                                              Reason for Appointment:  Hyperthyroidism, follow-up visit  Referring healthcare provider: ER physician   Chief complaint: Follow-up   History of Present Illness:   Baseline history: Prior to his initial consultation for about 4 to 5 months patient has had symptoms of shortness of breath on exertion, some palpitations, shakiness, feeling excessively warm and sweaty, weakness, and fatigue. 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 Also when he is trying to squat and get up he feels weak He tends to have loose stools after every meal  The patient had lost about 10-20 Lbs since these symptoms started  RECENT history:  He is taking methimazole 10 mg tablets, 2 tablets in the morning and 3 in the evening as prescribed on his last visit about 6 weeks ago Although is not complaining about palpitations and heat intolerance he gets out of breath and has some palpitations when he is trying to be active such as mowing the lawn Currently also on propranolol ER 80 mg daily  He thinks he is taking his medications regularly His weight is improving, previously had lost up to 20 pounds  Wt Readings from Last 3 Encounters:  09/25/18 150 lb 6.4 oz (68.2 kg)  08/07/18 148 lb 6.4 oz (67.3 kg)  07/03/18 137 lb 12.8 oz (62.5 kg)       Thyroid function tests as follows:     Lab Results  Component Value Date   FREET4 4.95 (H) 09/21/2018   FREET4 2.90 (H) 07/31/2018   FREET4 >5.50 (H) 06/22/2018   T3FREE >32.6 (H) 06/22/2018   TSH <0.010 (L) 06/22/2018    No results found for: THYROTRECAB   Allergies as of 09/25/2018   No Known Allergies     Medication List       Accurate as of September 25, 2018  2:43 PM. Always use your most recent med list.        methimazole 10 MG tablet Commonly known as:  TAPAZOLE Take 2 tablets at breakfast and 3 at dinner   propranolol ER 80 MG 24 hr capsule Commonly known as:  INDERAL LA Take 1 capsule (80 mg total) by mouth daily.           Past Medical History:  Diagnosis Date  . Thyroid disease     History reviewed. No pertinent surgical history.  Family History  Problem Relation Age of Onset  . Thyroid cancer Sister     Social History:  reports that he has never smoked. He has never used smokeless tobacco. He reports that he does not drink alcohol. No history on file for drug.  Allergies: No Known Allergies   Review of Systems  Thyroid is enlarged about twice  normal on the right and 1-1/2 times on the left tremor present. Skin is not moist Biceps reflexes are brisk   Examination:   BP 110/64 (BP Location: Left Arm, Patient Position: Sitting, Cuff Size: Normal)   Pulse 91   Ht 5\' 4"  (1.626 m)   Wt 150 lb 6.4 oz (68.2 kg)   SpO2 94%   BMI 25.82 kg/m         Assessment/Plan:   Hyperthyroidism, from Graves' disease   He has had significant baseline symptoms and increased thyroid levels  Even with 50 mg of methimazole his hyperthyroidism is not controlled Although he is not very symptomatic his thyroid level judged by free T4 is increasing significantly Currently not able to get his I-131 uptake and treatment schedule because of elective procedure being canceled  Most likely he should get his radioactive iodine treatment as he is not getting control with relatively large dose of methimazole  I-131 uptake will try to be arranged soon and message to be sent to nuclear medicine specialist  In the meantime he will increase his methimazole to 7 tablets daily  He will follow-up in another 6 weeks  Reather Littler 09/25/2018, 2:43 PM    Note: This office note was prepared with Dragon voice recognition system technology. Any transcriptional errors that result from this process  are unintentional.

## 2018-09-25 NOTE — Patient Instructions (Addendum)
4 tabs in am and 3 in pm When you are scheduled for the testing for the radioactive iodine treatment please note the following instructions:  7 days before the scheduled test dose stop the methimazole medication Restart the methimazole 3 days after the actual radioactive iodine treatment

## 2018-10-15 ENCOUNTER — Encounter (HOSPITAL_COMMUNITY): Payer: Self-pay

## 2018-11-03 ENCOUNTER — Other Ambulatory Visit: Payer: Self-pay

## 2018-11-03 ENCOUNTER — Other Ambulatory Visit (INDEPENDENT_AMBULATORY_CARE_PROVIDER_SITE_OTHER): Payer: Self-pay

## 2018-11-03 DIAGNOSIS — E0590 Thyrotoxicosis, unspecified without thyrotoxic crisis or storm: Secondary | ICD-10-CM

## 2018-11-03 LAB — T4, FREE: Free T4: 0.61 ng/dL (ref 0.60–1.60)

## 2018-11-03 LAB — ALT: ALT: 28 U/L (ref 0–53)

## 2018-11-06 ENCOUNTER — Ambulatory Visit (INDEPENDENT_AMBULATORY_CARE_PROVIDER_SITE_OTHER): Payer: Self-pay | Admitting: Endocrinology

## 2018-11-06 ENCOUNTER — Other Ambulatory Visit: Payer: Self-pay

## 2018-11-06 ENCOUNTER — Encounter: Payer: Self-pay | Admitting: Endocrinology

## 2018-11-06 ENCOUNTER — Ambulatory Visit: Payer: Self-pay | Admitting: Endocrinology

## 2018-11-06 VITALS — BP 120/80 | HR 70 | Ht 64.0 in | Wt 165.2 lb

## 2018-11-06 DIAGNOSIS — E0590 Thyrotoxicosis, unspecified without thyrotoxic crisis or storm: Secondary | ICD-10-CM

## 2018-11-06 NOTE — Patient Instructions (Addendum)
  3 tabs in am and 2 in pm Methimazole  When you are scheduled for the testing for the radioactive iodine treatment please note the following instructions:   7 days before the scheduled test dose stop the methimazole medication Restart the methimazole 3 days after the actual radioactive iodine treatment

## 2018-11-06 NOTE — Progress Notes (Signed)
Patient ID: Aaron Grant, male   DOB: [birth year], 33 y.o.   MRN: [id number]                                                                                                              Reason for Appointment:  Hyperthyroidism, follow-up visit  Referring healthcare provider: ER physician   Chief complaint: Follow-up   History of Present Illness:   Baseline history: Prior to his initial consultation for about 4 to 5 months patient has had symptoms of shortness of breath on exertion, some palpitations, shakiness, feeling excessively warm and sweaty, weakness, and fatigue. 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 Also when he is trying to squat and get up he feels weak He tends to have loose stools after every meal  The patient had lost about 10-20 Lbs since these symptoms started  RECENT history:  He is taking methimazole 10 mg tablets  He has required progressively higher doses of methimazole because of worsening hyperthyroidism especially on the labs of 09/21/2018 He was asked to increase the dose to 4 tablets in the morning and 3 in the evening giving him 70 mg a day about 6 weeks ago Although it is difficult to get a good history from him he says that he is not as short of breath on exertion and probably not as tired as before Also appears to have gained 15 pounds He has noticed a little swelling of his right foot which is painless He thinks he may be feeling a little more colder than usual Although he was also on propranolol ER 80 mg daily he says he has been out of it for the last 2 weeks  He thinks he is taking his medications regularly   Wt Readings from Last 3 Encounters:  11/06/18 165 lb 3.2 oz (74.9 kg)  09/25/18 150 lb 6.4 oz (68.2 kg)  08/07/18 148 lb 6.4 oz (67.3 kg)     He is scheduled for I-131 uptake on 11/26/2018  Thyroid function tests as follows:     Lab Results  Component Value Date   FREET4 0.61 11/03/2018   FREET4 4.95 (H) 09/21/2018   FREET4 2.90 (H) 07/31/2018   T3FREE >32.6 (H) 06/22/2018   TSH <0.010 (L) 06/22/2018    No results found for: THYROTRECAB   Allergies as of 11/06/2018   No Known Allergies     Medication List       Accurate as of Nov 06, 2018 11:07 AM. If you have any questions, ask your nurse or doctor.        methimazole 10 MG tablet Commonly known as:  TAPAZOLE Take 4 tablets at breakfast and 3 at dinner   propranolol ER 80 MG 24 hr capsule Commonly known as:  INDERAL LA Take 1 capsule (80 mg total) by mouth daily.           Past Medical History:  Diagnosis Date  . Thyroid disease  History reviewed. No pertinent surgical history.  Family History  Problem Relation Age of Onset  . Thyroid cancer Sister     Social History:  reports that he has never smoked. He has never used smokeless tobacco. He reports that he does not drink alcohol. No history on file for drug.  Allergies: No Known Allergies   Review of Systems  Thyroid is enlarged about 1-1/2 times normal on the right and barely palpable on the left  No fine tremor present. No swelling of the eyes Biceps reflexes are brisk Mild swelling of the right foot but no swelling of the legs and minimal swelling of the left foot present   Examination:   BP 120/80 (BP Location: Right Arm, Patient Position: Sitting, Cuff Size: Normal)   Pulse 70   Ht 5\' 4"  (1.626 m)   Wt 165 lb 3.2 oz (74.9 kg)   SpO2 97%   BMI 28.36 kg/m         Assessment/Plan:   Hyperthyroidism, from Graves' disease   He has had significant baseline symptoms and markedly abnormal thyroid levels  Now currently with 70 mg of methimazole his hyperthyroidism is well controlled  He has been taking this since early April May be getting mildly hypothyroid because of a low normal free T4 and some symptoms of weight gain, slight edema and cold intolerance  His thyroid enlargement appears smaller   Since he has had severe disease he will still be a candidate for I-131 treatment and this will be done after his uptake on 6/4  In the meantime he will reduce his methimazole to 5 tablets daily again Does not appear to be needing Inderal and he will not restart this  He will follow-up in another 6 weeks  Reather LittlerAjay Garth Diffley 11/06/2018, 11:07 AM    Note: This office note was prepared with Dragon voice recognition system technology. Any transcriptional errors that result from this process are unintentional.

## 2018-11-26 ENCOUNTER — Ambulatory Visit (HOSPITAL_COMMUNITY)
Admission: RE | Admit: 2018-11-26 | Discharge: 2018-11-26 | Disposition: A | Discharge status: Home / Self Care | Payer: Self-pay | Source: Ambulatory Visit | Attending: Endocrinology | Admitting: Endocrinology

## 2018-11-26 ENCOUNTER — Other Ambulatory Visit: Payer: Self-pay

## 2018-11-26 DIAGNOSIS — E0590 Thyrotoxicosis, unspecified without thyrotoxic crisis or storm: Secondary | ICD-10-CM | POA: Insufficient documentation

## 2018-11-26 MED ORDER — SODIUM IODIDE I-123 7.4 MBQ CAPS
457.0000 | ORAL_CAPSULE | Freq: Once | ORAL | Status: AC
  Administered 2018-11-26: 457 via ORAL

## 2018-11-27 ENCOUNTER — Ambulatory Visit (HOSPITAL_COMMUNITY)
Admission: RE | Admit: 2018-11-27 | Discharge: 2018-11-27 | Disposition: A | Discharge status: Home / Self Care | Payer: Self-pay | Source: Ambulatory Visit | Attending: Endocrinology | Admitting: Endocrinology

## 2018-11-30 ENCOUNTER — Other Ambulatory Visit: Payer: Self-pay | Admitting: Endocrinology

## 2018-11-30 DIAGNOSIS — E0590 Thyrotoxicosis, unspecified without thyrotoxic crisis or storm: Secondary | ICD-10-CM

## 2018-12-03 ENCOUNTER — Other Ambulatory Visit (INDEPENDENT_AMBULATORY_CARE_PROVIDER_SITE_OTHER): Payer: Self-pay

## 2018-12-03 ENCOUNTER — Other Ambulatory Visit: Payer: Self-pay

## 2018-12-03 DIAGNOSIS — E0590 Thyrotoxicosis, unspecified without thyrotoxic crisis or storm: Secondary | ICD-10-CM

## 2018-12-03 LAB — T4, FREE: Free T4: 3.23 ng/dL — ABNORMAL HIGH (ref 0.60–1.60)

## 2018-12-03 LAB — T3, FREE: T3, Free: 7.3 pg/mL — ABNORMAL HIGH (ref 2.3–4.2)

## 2018-12-05 ENCOUNTER — Other Ambulatory Visit: Payer: Self-pay | Admitting: Endocrinology

## 2018-12-18 ENCOUNTER — Other Ambulatory Visit: Payer: Self-pay

## 2018-12-18 ENCOUNTER — Ambulatory Visit (HOSPITAL_COMMUNITY)
Admission: RE | Admit: 2018-12-18 | Discharge: 2018-12-18 | Disposition: A | Discharge status: Home / Self Care | Payer: Self-pay | Source: Ambulatory Visit | Attending: Endocrinology | Admitting: Endocrinology

## 2018-12-18 DIAGNOSIS — E0590 Thyrotoxicosis, unspecified without thyrotoxic crisis or storm: Secondary | ICD-10-CM | POA: Insufficient documentation

## 2018-12-18 MED ORDER — SODIUM IODIDE I 131 CAPSULE
12.7600 | Freq: Once | INTRAVENOUS | Status: AC | PRN
  Administered 2018-12-18: 14:00:00 12.76 via ORAL

## 2018-12-22 ENCOUNTER — Other Ambulatory Visit: Payer: Self-pay | Admitting: Endocrinology

## 2018-12-22 DIAGNOSIS — E0590 Thyrotoxicosis, unspecified without thyrotoxic crisis or storm: Secondary | ICD-10-CM

## 2019-01-04 ENCOUNTER — Other Ambulatory Visit: Payer: Self-pay

## 2019-01-04 ENCOUNTER — Other Ambulatory Visit (INDEPENDENT_AMBULATORY_CARE_PROVIDER_SITE_OTHER): Payer: Self-pay

## 2019-01-04 DIAGNOSIS — E0590 Thyrotoxicosis, unspecified without thyrotoxic crisis or storm: Secondary | ICD-10-CM

## 2019-01-04 LAB — T4, FREE: Free T4: 2.42 ng/dL — ABNORMAL HIGH (ref 0.60–1.60)

## 2019-01-06 ENCOUNTER — Ambulatory Visit: Payer: Self-pay | Admitting: Endocrinology

## 2019-01-06 DIAGNOSIS — Z0289 Encounter for other administrative examinations: Secondary | ICD-10-CM

## 2019-03-21 ENCOUNTER — Other Ambulatory Visit: Payer: Self-pay | Admitting: Endocrinology

## 2019-03-21 DIAGNOSIS — E0590 Thyrotoxicosis, unspecified without thyrotoxic crisis or storm: Secondary | ICD-10-CM

## 2019-03-24 ENCOUNTER — Telehealth: Payer: Self-pay | Admitting: Endocrinology

## 2019-03-24 NOTE — Telephone Encounter (Signed)
-----   Message from Elayne Snare, MD sent at 03/21/2019  9:26 PM EDT ----- Regarding: follow-up overdue Please schedule labs and then office visit.  Please let him know it is very important to follow-up for his thyroid

## 2019-03-24 NOTE — Telephone Encounter (Signed)
LMTCB and schedule lab and follow up appointment

## 2019-03-30 NOTE — Telephone Encounter (Signed)
LMTCB (x2) and schedule lab and follow up appointment

## 2019-10-27 ENCOUNTER — Encounter (HOSPITAL_COMMUNITY): Payer: Self-pay

## 2019-10-27 ENCOUNTER — Other Ambulatory Visit: Payer: Self-pay

## 2019-10-27 ENCOUNTER — Ambulatory Visit (HOSPITAL_COMMUNITY)
Admission: EM | Admit: 2019-10-27 | Discharge: 2019-10-27 | Disposition: A | Discharge status: Home / Self Care | Payer: Self-pay | Attending: Family Medicine | Admitting: Family Medicine

## 2019-10-27 DIAGNOSIS — M79672 Pain in left foot: Secondary | ICD-10-CM

## 2019-10-27 DIAGNOSIS — M7662 Achilles tendinitis, left leg: Secondary | ICD-10-CM

## 2019-10-27 MED ORDER — DICLOFENAC SODIUM 75 MG PO TBEC: 75.0000 mg | DELAYED_RELEASE_TABLET | Freq: Two times a day (BID) | ORAL | 0 refills | Status: AC

## 2019-10-27 NOTE — ED Triage Notes (Signed)
Pt presents with sharp pain on bottom of left foot not injury related today.

## 2019-10-30 NOTE — ED Provider Notes (Signed)
[facility]   [id number] 10/27/19 Arrival Time: 3762  ASSESSMENT & PLAN:  1. Left foot pain   2. Achilles tendinitis of left lower extremity     No indication for plain imaging today.  Begin trail of: Meds ordered this encounter  Medications  . diclofenac (VOLTAREN) 75 MG EC tablet    Sig: Take 1 tablet (75 mg total) by mouth 2 (two) times daily.    Dispense:  14 tablet    Refill:  0    Declines CAM walker at this time. WBAT.  Recommend: Follow-up Information    [facility].   Why: If worsening or failing to improve as anticipated. Contact information: [address] [place] [phone]           Reviewed expectations re: course of current medical issues. Questions answered. Outlined signs and symptoms indicating need for more acute intervention. Patient verbalized understanding. After Visit Summary given.  SUBJECTIVE: History from: patient. Aaron Grant is a 34 y.o. male who reports fairly persistent moderate pain of his left heel toward ankle; described as aching and with occasional sharp pains. Onset: gradual. First noted: few days ago.  Injury/trama: no. Walks/stands a lot at work; questions relation. Symptoms have progressed to a point and plateaued since beginning. Aggravating factors: certain movements and prolonged walking/standing. Alleviating factors: rest. Associated symptoms: none reported. Extremity sensation changes or weakness: none. Self treatment: none reported.  History of similar: no.  History reviewed. No pertinent surgical history.    OBJECTIVE:  Vitals:   10/27/19 1447  BP: 126/81  Pulse: 67  Resp: 17  Temp: 97.9 F (36.6 C)  TempSrc: Oral  SpO2: 96%    General appearance: alert; no distress HEENT: [place]; AT Neck: supple with FROM Resp: unlabored respirations Extremities: . LLE: warm with well perfused appearance; fairly well localized moderate  tenderness over left Achilles tendon at heel insertion without overlying erythema; no crepitus; without gross deformities; swelling: none; bruising: none; ankle ROM: normal CV: brisk extremity capillary refill of LLE; 2+ DP pulse of LLE. Skin: warm and dry; no visible rashes Neurologic: gait normal; normal sensation and strength of LLE Psychological: alert and cooperative; normal mood and affect     No Known Allergies  Past Medical History:  Diagnosis Date  . Thyroid disease    Social History   Socioeconomic History  . Marital status: Married    Spouse name: Not on file  . Number of children: Not on file  . Years of education: Not on file  . Highest education level: Not on file  Occupational History  . Not on file  Tobacco Use  . Smoking status: Never Smoker  . Smokeless tobacco: Never Used  Substance and Sexual Activity  . Alcohol use: No  . Drug use: Not on file  . Sexual activity: Not on file  Other Topics Concern  . Not on file  Social History Narrative  . Not on file   Social Determinants of Health   Financial Resource Strain:   . Difficulty of Paying Living Expenses:   Food Insecurity:   . Worried About Charity fundraiser in the Last Year:   . Arboriculturist in the Last Year:   Transportation Needs:   . Film/video editor (Medical):   Marland Kitchen Lack of Transportation (Non-Medical):   Physical Activity:   . Days of Exercise per Week:   . Minutes of Exercise per Session:  Stress:   . Feeling of Stress :   Social Connections:   . Frequency of Communication with Friends and Family:   . Frequency of Social Gatherings with Friends and Family:   . Attends Religious Services:   . Active Member of Clubs or Organizations:   . Attends Banker Meetings:   Marland Kitchen Marital Status:    Family History  Problem Relation Age of Onset  . Thyroid cancer Sister    History reviewed. No pertinent surgical history.    Mardella Layman, MD 10/30/19 [phone]
